# Patient Record
Sex: Male | Born: 1935 | Race: White | Hispanic: No | Marital: Married
Health system: Midwestern US, Academic
[De-identification: ages and names within clinical notes are randomized; demographics above are authoritative.]

---

## 2018-07-09 ENCOUNTER — Encounter: Admit: 2018-07-09 | Discharge: 2018-07-09 | Payer: MEDICARE

## 2018-07-09 ENCOUNTER — Inpatient Hospital Stay
Admit: 2018-07-09 | Discharge: 2018-07-13 | Disposition: A | Discharge status: Home Health | Payer: MEDICARE | Source: Other Acute Inpatient Hospital | Admitting: Critical Care Medicine

## 2018-07-09 DIAGNOSIS — I639 Cerebral infarction, unspecified: Principal | ICD-10-CM

## 2018-07-09 DIAGNOSIS — R740 Nonspecific elevation of levels of transaminase and lactic acid dehydrogenase [LDH]: ICD-10-CM

## 2018-07-09 DIAGNOSIS — I10 Essential (primary) hypertension: ICD-10-CM

## 2018-07-09 DIAGNOSIS — A419 Sepsis, unspecified organism: Secondary | ICD-10-CM

## 2018-07-09 LAB — URINALYSIS DIPSTICK
Lab: NEGATIVE
Lab: NEGATIVE
Lab: NEGATIVE
Lab: NEGATIVE
Lab: NEGATIVE
Lab: NEGATIVE
Lab: NEGATIVE

## 2018-07-09 LAB — COMPREHENSIVE METABOLIC PANEL
Lab: 1 mg/dL (ref 0.4–1.24)
Lab: 140 MMOL/L — ABNORMAL LOW (ref 137–147)
Lab: 2.7 mg/dL — ABNORMAL HIGH (ref 0.3–1.2)
Lab: 5.5 g/dL — ABNORMAL LOW (ref 6.0–8.0)
Lab: 558 U/L — ABNORMAL HIGH (ref 7–40)
Lab: 742 U/L — ABNORMAL HIGH (ref 7–56)
Lab: >60 mL/min (ref >60)

## 2018-07-09 LAB — BLOOD GASES, ARTERIAL
Lab: 0.5 MMOL/L
Lab: 116 mmHg — ABNORMAL HIGH (ref 80–100)
Lab: 34 mmHg — ABNORMAL LOW (ref 35–45)
Lab: 7.4 (ref 7.35–7.45)

## 2018-07-09 LAB — CREATININE-URINE RANDOM: Lab: 28 mg/dL

## 2018-07-09 LAB — C REACTIVE PROTEIN (CRP): Lab: 8.6 mg/dL — ABNORMAL HIGH (ref <1.0)

## 2018-07-09 LAB — TROPONIN-I: Lab: 1.1 ng/mL — ABNORMAL HIGH (ref 0.0–0.05)

## 2018-07-09 LAB — CBC AND DIFF
Lab: 0 10*3/uL (ref 0–0.20)
Lab: 15 10*3/uL — ABNORMAL HIGH (ref 4.5–11.0)

## 2018-07-09 LAB — PROCALCITONIN: Lab: 1.4 ng/mL — ABNORMAL HIGH (ref <0.11)

## 2018-07-09 LAB — D-DIMER: Lab: 454 ng{FEU}/mL — ABNORMAL HIGH (ref <500)

## 2018-07-09 LAB — FERRITIN: Lab: 822 ng/mL — ABNORMAL HIGH (ref 30–300)

## 2018-07-09 LAB — URINALYSIS, MICROSCOPIC

## 2018-07-09 LAB — LDH-LACTATE DEHYDROGENASE: Lab: 412 U/L — ABNORMAL HIGH (ref 100–210)

## 2018-07-09 LAB — SODIUM-URINE RANDOM: Lab: 25 MMOL/L

## 2018-07-09 LAB — PHOSPHORUS: Lab: 2.6 mg/dL (ref 2.0–4.5)

## 2018-07-09 MED ORDER — VANCOMYCIN 1G/250ML D5W IVPB (VIAL2BAG)
1000 mg | Freq: Once | INTRAVENOUS | 0 refills | Status: CP
Start: 2018-07-09 — End: ?
  Administered 2018-07-09 (×2): 1000 mg via INTRAVENOUS

## 2018-07-09 MED ORDER — ERTAPENEM 1GM IVP
1 g | INTRAVENOUS | 0 refills | Status: DC
Start: 2018-07-09 — End: 2018-07-12
  Administered 2018-07-10 – 2018-07-12 (×3): 1 g via INTRAVENOUS

## 2018-07-09 MED ORDER — CEFEPIME 2G/100ML NS IVPB (MB+)
2 g | Freq: Once | INTRAVENOUS | 0 refills | Status: DC
Start: 2018-07-09 — End: 2018-07-09
  Administered 2018-07-09 (×2): 2 g via INTRAVENOUS

## 2018-07-09 MED ORDER — VANCOMYCIN 1,500 MG IVPB
1500 mg | INTRAVENOUS | 0 refills | Status: DC
Start: 2018-07-09 — End: 2018-07-10

## 2018-07-09 MED ORDER — VANCOMYCIN IVPB
15 mg/kg | Freq: Once | INTRAVENOUS | 0 refills | Status: DC
Start: 2018-07-09 — End: 2018-07-09

## 2018-07-09 MED ORDER — VANCOMYCIN PHARMACY TO MANAGE
1 | 0 refills | Status: DC
Start: 2018-07-09 — End: 2018-07-10

## 2018-07-09 MED ORDER — ENOXAPARIN 40 MG/0.4 ML SC SYRG
40 mg | Freq: Every day | SUBCUTANEOUS | 0 refills | Status: DC
Start: 2018-07-09 — End: 2018-07-13
  Administered 2018-07-10 – 2018-07-13 (×4): 40 mg via SUBCUTANEOUS

## 2018-07-09 MED ORDER — CEFEPIME 2G/100ML NS IVPB (MB+)(EXTENDED INFUSION)
2 g | INTRAVENOUS | 0 refills | Status: DC
Start: 2018-07-09 — End: 2018-07-09

## 2018-07-09 NOTE — Progress Notes
gallstones in 2008. Patient was found to have a temp of 103 in the ED there and was hypotensive in the 70s systolic. He received 3 L of saline as bolus with improvement in his pressures to 90s systolic. His CT scan of the abd was unremarkable. He was also found to be confused per the wife who accompanies him.??? Patient was started on levaquin at the OSH and given his low blood pressure and the possible need for pressor support, he was transferred to the [facility] ICU via helicopter.??? Upon arrival he was normotensive, afebrile, pain free.??? ICU initially started him on Vanc, Cefipime and Flagyl and continued Levaquin but later switched his antibiotic coverage to Ertapenem and Vanc on 7/2.  U/S on 7/2 of abdomen unremarkable regarding gallbladder and bile ducts. During his hypotensive episode patient developed shock liver with LFTs trending down.??? His amylase and lipase at the OSH were normal and repeat labs at [place] are normal. CT-Chest on 7/3 consistent with aspiration vs pneumonia.  [place] gram stain of blood cultures revealed gram negative rods and vancomycin was discontinued on 7/3.  Patient continued to be stable throughout his admission with transaminases and white count trending down.  Patient seen by PT/OT/Speech Language pathology. Blood cultures from [facility] were positive for Klebsiella sensitive to Levaquin. Patient switched to Levaquin PO ending 10/04/2015.  Currently patient is stable with no complaints and is ready discharge.??? U/S on 7/2 of abdomen unremarkable regarding gallbladder and bile ducts.

## 2018-07-09 NOTE — Progress Notes
Patient arrived to room # [phone]*) via cart accompanied by EMS. Patient transferred to the bed with assistance. Bedside safety checks completed. Initial patient assessment completed. Refer to flowsheet for details.    Admission skin assessment completed with: Cherrie Distance, RN    Pressure injury present on arrival?: Yes    1. Head/Face/Neck: No  2. Trunk/Back: No  3. Upper Extremities: No  4. Lower Extremities: No  5. Pelvic/Coccyx: Yes  6. Assessed for device associated injury? Yes  7. Malnutrition Screening Tool (Nursing Nutrition Assessment) Completed? No    See Doc Flowsheet for additional wound details.     INTERVENTIONS: q2 turns and criticaid applied to coccyx PRN

## 2018-07-09 NOTE — H&P (View-Only)
- will discuss with general surgery once images available  - repeat LFTs  - check lipase    Possible dysphagia  - pt reports occasional coughing when eating  - SLP swallow study 2017: mild to moderate oropharyngeal dysphagia  - recommended regular solids with nectar thick liquids at that time  Plan  - NPO (for possible surgery)  - plan for SLP swallow study in the a.m.    RENAL  AKI  - baseline Cr ~ 0.6  - Cr 1.19, BUN 22 at OSH  Plan  - check FeNa  - avoid nephrotoxic agents    ENDO  - no acute issues    ID  Sepsis  - WBC 15.6, afebrile  - lactic acid 3.1  - blood cultures sent at OSH   - s/p vanco and cefepime at OSH  - h/o Klebsiella bacteremia at [facility] (susceptible to Levaquin, ertapenem)  Plan  - vanco, ertapenem  - culture blood  - check procalcitonin, RVP  - check MRSA pneumonia PCR    FEN  - no IVF  - replace electrolytes as needed  - NPO for now    PROPHYLAXIS  Lines: PIVs   Urinary Catheter:  No  VTE: Lovenox, SCDs  GI: No    Insulin: No  PT/OT: Yes    Code status: Full code  Disposition: Admit to ICU    99291 x 1, 99292 x 1 - pt critically ill with sepsis, NSTEMI (Type II), elevated liver enzymes, hypoxia, and hypotension responsive to fluids. I spent 85 minutes providing critical care services including:  ??? reviewing outside records and obtaining history from the patient/family members  ??? performing a physical examination  ??? serially reviewing laboratory, telemetry, hemodynamic, oximetry, and respiratory data  ??? reviewing radiographic images  ??? reviewing medications  ??? managing fluids/electrolytes, antibiotics, and ICU prophylaxis  ??? developing the overall plan of care.       [facility] Alaycia Eardley APRN  Pager [phone]  M2 pager [phone]  ___________________________________________________________________________  Primary Care Physician: Tildon Husky      CHIEF COMPLAINT:  Fall    HISTORY OF PRESENT ILLNESS: Stanley Garrett is a 83 y.o. male with PMH of CVA, CAD, HTN, and HLD who presented to [facility] this morning after falling. He endorsed weakness, but no dizziness. He did not hit his head. On presentation he was hypotensive, hypoxic, and had elevations in WBC and LFTs. He received of NS with improvement of BP. CT head negative for acute process. CT abdomen showed a dilated gallbladder concerning for acalculous cholecystitis. He denies abdominal pain. There was no surgeon available at [facility] so he was transferred to [facility] for possible surgical evaluation. He denies fevers, recent illness, and sick contacts. He lives with his wife and uses a walker to get around the house. The only medication he takes is simvastatin.    PMH:  Medical History:   Diagnosis Date   ??? CVA (cerebral vascular accident) (HCC)    ??? HTN (hypertension)         PSH:  Surgical History:   Procedure Laterality Date   ??? PROSTATE SURGERY          SOCIAL HISTORY:  Social History     Socioeconomic History   ??? Marital status: Married     Spouse name: Not on file   ??? Number of children: Not on file   ??? Years of education: Not on file   ??? Highest education level: Not on file  Occupational History   ??? Not on file   Tobacco Use   ??? Smoking status: Former Smoker   Substance and Sexual Activity   ??? Alcohol use: Not on file   ??? Drug use: Not on file   ??? Sexual activity: Not on file   Other Topics Concern   ??? Not on file   Social History Narrative   ??? Not on file        FAMILY HISTORY:  Family History   Problem Relation Age of Onset   ??? Heart Failure Father         IMMUNIZATIONS:   There is no immunization history on file for this patient.        ALLERGIES:  Penicillins and Sulfa (sulfonamide antibiotics)    HOME MEDICATIONS:  Medications Prior to Admission   Medication Sig   ??? fesoterodine ER(+) (TOVIAZ) 8 mg tablet Take 1 Tab by mouth daily.   ??? fish oil /omega-3 fatty acids (SEA-OMEGA) 340/1000 mg capsule Take 1 Cap by mouth daily. PHART 7.44   PO2ART 116*                 Radiology and Other Diagnostic Procedures Review:    Reviewed

## 2018-07-09 NOTE — Progress Notes
Pharmacy Vancomycin Note  Subjective:   Stanley Garrett is a 83 y.o. male being treated for Sepsis.    Objective:     Current Vancomycin Orders   Medication Dose Route Frequency   ??? vancomycin (VANCOCIN) 1,000 mg in dextrose 5% (D5W) 250 mL IVPB (Vial2Bag)  1,000 mg Intravenous ONCE    Followed by   ??? [START ON 07/10/2018] vancomycin (VANCOCIN) 1,500 mg in sodium chloride 0.9% (NS) IVPB  1,500 mg Intravenous Q24H*   ??? vancomycin, pharmacy to manage  1 each Service Per Pharmacy     Start Date of  vancomycin therapy: 07/09/2018  Additional Abx: cefepime    White Blood Cells   Date/Time Value Ref Range Status   07/09/2018 1424 15.1 (H) 4.5 - 11.0 K/UL Final     Creatinine   Date/Time Value Ref Range Status   07/09/2018 1424 1.07 0.4 - 1.24 MG/DL Final     Blood Urea Nitrogen   Date/Time Value Ref Range Status   07/09/2018 1424 19 7 - 25 MG/DL Final     Estimated CrCl: 60 ml/min  No intake or output data in the 24 hours ending 07/09/18 1624   UOP:    Actual Weight:  78.2 kg (172 lb 6.4 oz)  Dosing BW:  78 kg   Drug Levels:  No results found for: VANCOMYCIN 2HR POST DOSE, VANCOMYCIN TROUGH, VANCOMYCIN RANDOM    Assessment:   Target levels for this patient:  1.  AUC (mcg*h/mL):  400-600  2.  Trough 10-15 mcg/ml    Plan:   1. Patient received 1 g vancomycin at OSH 1130 today. Will give an additional 1 g now to complete 2 g loading dose (25 mg/kg) then begin 1500 mg q24h. This will attain predicted AUC 506, trough 11.1  2. Next scheduled level(s): TBD  3. Pharmacy will continue to monitor and adjust therapy as needed.    Franz Dell, [facility]  07/09/2018

## 2018-07-10 ENCOUNTER — Encounter: Admit: 2018-07-10 | Discharge: 2018-07-10 | Payer: MEDICARE

## 2018-07-10 ENCOUNTER — Inpatient Hospital Stay: Admit: 2018-07-10 | Discharge: 2018-07-10 | Payer: MEDICARE

## 2018-07-10 DIAGNOSIS — J690 Pneumonitis due to inhalation of food and vomit: Secondary | ICD-10-CM

## 2018-07-10 LAB — RVP VIRAL PANEL PCR

## 2018-07-10 LAB — TROPONIN-I
Lab: 1.1 ng/mL — ABNORMAL HIGH (ref 0.0–0.05)
Lab: 1.3 ng/mL — ABNORMAL HIGH (ref 0.0–0.05)
Lab: 0.8 ng/mL — ABNORMAL HIGH (ref 0.0–0.05)

## 2018-07-10 LAB — MRSA PNEUMONIA SCREEN

## 2018-07-10 MED ORDER — SODIUM CHLORIDE 0.9 % IV SOLP
INTRAVENOUS | 0 refills | Status: DC
Start: 2018-07-10 — End: 2018-07-12
  Administered 2018-07-10 – 2018-07-12 (×4): 1000.000 mL via INTRAVENOUS

## 2018-07-10 MED ORDER — SODIUM CHLORIDE 0.9 % IJ SOLN
50 mL | Freq: Once | INTRAVENOUS | 0 refills | Status: CP
Start: 2018-07-10 — End: ?
  Administered 2018-07-11: 01:00:00 50 mL via INTRAVENOUS

## 2018-07-10 MED ORDER — IOHEXOL 350 MG IODINE/ML IV SOLN
80 mL | Freq: Once | INTRAVENOUS | 0 refills | Status: CP
Start: 2018-07-10 — End: ?
  Administered 2018-07-11: 01:00:00 80 mL via INTRAVENOUS

## 2018-07-10 NOTE — Progress Notes
1350: Assumed pt care. Bedside safety check performed. Assessment complete per ICU flowsheet. VSS per pt trends. Will continue to monitor. Patient alert, oriented, following commands, no pain, plan of care reviewed.    1600: Assessment complete per ICU flowsheet. VSS per pt trends. Will continue to monitor. Patient alert, oriented, following commands, no pain.   1641: Patient's Troponin from admission resulted as 1.16, EKG obtained. Patient in SR, with RBB and Lafaire[place], APRN informed.

## 2018-07-10 NOTE — Case Management (ED)
Patient Address/Phone  [address]  [place] [address]  [phone] (home)     Emergency Contact  Extended Emergency Contact Information  Primary Emergency Contact: Kemet, Nijjar  Work Phone: [phone]  Mobile Phone: [phone]  Relation: Spouse    Healthcare Directive       Transportation  Does the patient need discharge transport arranged?: No  Transportation Name, Phone and Availability #1: Pts wife Dale Durham  Does the patient use Medicaid Transportation?: No    Expected Discharge Date  Expected Discharge Date: 07/13/18  Expected Discharge Time: 1600    Living Situation Prior to Admission  ? Living Arrangements  Type of Residence: Home, dependent on others  Living Arrangements: Spouse/significant other  Financial risk analyst / Tub: Psychologist, counselling  How many levels in the residence?: 1  Can patient live on one level if needed?: Yes  Does residence have entry and/or side stairs?: (Ramp access)  Assistance needed prior to admit or anticipated on discharge: Yes  Who provides assistance or could if needed?: Pts wife  Are they in good health?: Yes  Can support system provide 24/7 care if needed?: Yes  ? Level of Function   Prior level of function: Needs assist with ADLs  Which ADLs require assistance?: Dressing, bathing  Who assists with ADLs?: Pts wife  ? Cognitive Abilities   Cognitive Abilities: Alert and Oriented, Engages in problem solving and planning, Participates in decision making, Recognizes impact of health condition on lifestyle    Financial Resources  ? Coverage  Primary Insurance: Medicare  Secondary Insurance: Medicare Supplement  Additional Coverage: RX    ? Source of Income   Source Of Income: SSI  ? Financial Assistance Needed?  N/A    Psychosocial Needs  ? Mental Health  Mental Health History: No  ? Substance Use History  Substance Use History Screen: No  ? Other  N/A    Current/Previous Services  ? PCP  Tildon Husky, [phone], [phone]  ? Pharmacy

## 2018-07-10 NOTE — Progress Notes
CAD, HTN, and HLD who presented to [facility] this morning after falling. He was hypotensive on arrival to the OSH which resolved with of NS.???Labwork showed elevated WBCs and elevated liver enzymes.???A CT abdomen was concerning for acalculous cholecystitis so he was transferred to [facility] for surgical evaluation.    IMPRESSION CXR   Development of partially consolidating bibasilar opacities, greatest at   the medial lower lobes and favored to represent a combination of   atelectasis and pneumonia.    Handedness: Right  Hearing: WFL  Lives With: Spouse  Receives Help From: Spouse  Psychosocial Status: Willing and Cooperative to Participate  Persons Present: None    Subjective*  Pain: Patient has no complaint of pain  Pain Level Current*: No pain  Trach Presence: No  Feeding Tube Present During Eval: None    Nutrition*  Nutrition Prior To Hospitalization: Regular, Thin Liquids(per pt report he has not been taking thickened liquids)  Current Form Of Nutrition: NPO    Oral Mech Exam  Oral Mech WFL*: No  Oral Mech Exam Summary*: Minimal dysphonia observed; slowed labial AMRs w/ normal ROM and strength of jaw, tongue & labial structures.      Swallow Strategies  Small Bites and Sips: Effective(which pt effectively independently completed)  Slow Rate of Intake: Effective    Education*  Persons Educated: Patient  Barriers To Learning: Cognitive Deficits  Interventions: Staff Educated  Teaching Methods: Verbal  Topics: Dysphagia  Patient Response: Verbalized Understanding  Goal Formulation: With Patient    Clinical Swallow Goals*  Goal : The pt will tolerate a soft diet w/ thin liquids w/ < 5% overt s/s aspiation.   Goal : The pt will participate in ongoing swallow eval given min cues to maintain attention.      Therapist:Jaelen Soth Millard-Harsche, MA, L/CCC-SLP         Voalte: [id number]  Date:07/10/2018

## 2018-07-10 NOTE — Progress Notes
Gastrointestinal: positive for dysphagia    Objective:                          Vital Signs: Last Filed                 Vital Signs: 24 Hour Range   BP: 132/72 (04/20 1036)  Temp: 37.1 ???C (98.7 ???F) (04/20 1036)  Pulse: 63 (04/20 1036)  Respirations: 18 PER MINUTE (04/20 1036)  SpO2: 93 % (04/20 1036)  SpO2 Pulse: 58 (04/20 0900) BP: (109-142)/(50-72)   Temp:  [36.7 ???C (98.1 ???F)-37.1 ???C (98.8 ???F)]   Pulse:  [58-82]   Respirations:  [8 PER MINUTE-28 PER MINUTE]   SpO2:  [92 %-99 %]      Vitals:    07/09/18 1351   Weight: 78.2 kg (172 lb 6.4 oz)       Intake/Output Summary:  (Last 24 hours)    Intake/Output Summary (Last 24 hours) at 07/10/2018 1409  Last data filed at 07/10/2018 1247  Gross per 24 hour   Intake 360 ml   Output 1108 ml   Net -748 ml           Physical Exam    General: AO3x, no  distress   Head: NCAT  Eyes: PERRLA EOMI,   ENT: Normal external canals b/l  Neck: supple   Lungs: Bibasilar coarse rales right more than left  Chest wall: No tenderness  Heart: RRR, no murmurs.  Abdomen: soft ,non-tender, bs+  Extremities: no  edema b/l  Peripheral pulses: 2+ and symmetric, all extremities  Skin: Pale Skin, texture, turgor normal.  Lymph nodes: No cervical LAD  Neurologic:grossly normal except mild residual left-sided weakness  Musculoskeletal: ntd  Lab Review  24-hour labs:    Results for orders placed or performed during the hospital encounter of 07/09/18 (from the past 24 hour(s))   CBC AND DIFF    Collection Time: 07/09/18  2:24 PM   Result Value Ref Range    White Blood Cells 15.1 (H) 4.5 - 11.0 K/UL    RBC 4.33 (L) 4.4 - 5.5 M/UL    Hemoglobin 13.3 (L) 13.5 - 16.5 GM/DL    Hematocrit 16.1 40 - 50 %    MCV 92.3 80 - 100 FL    MCH 30.7 26 - 34 PG    MCHC 33.3 32.0 - 36.0 G/DL    RDW 09.6 11 - 15 %    Platelet Count 159 150 - 400 K/UL    MPV 10.0 7 - 11 FL    Neutrophils 85 (H) 41 - 77 %    Lymphocytes 8 (L) 24 - 44 %    Monocytes 7 4 - 12 %    Eosinophils 0 0 - 5 %    Basophils 0 0 - 2 % Ferritin 822 (H) 30 - 300 NG/ML   LDH-LACTATE DEHYDROGENASE    Collection Time: 07/09/18  2:24 PM   Result Value Ref Range    Lactate Dehydrogenase 412 (H) 100 - 210 U/L   LIPASE    Collection Time: 07/09/18  2:24 PM   Result Value Ref Range    Lipase 10 (L) 11 - 82 U/L   CULTURE-BLOOD W/SENSITIVITY    Collection Time: 07/09/18  2:55 PM   Result Value Ref Range    Battery Name BLOOD CULTURE     Specimen Description BLOOD  RIGHT  ANTECUBITAL       Special Requests  NONE     Culture NO GROWTH 1 DAY     Report Status     CULTURE-BLOOD W/SENSITIVITY    Collection Time: 07/09/18  4:05 PM   Result Value Ref Range    Battery Name BLOOD CULTURE     Specimen Description BLOOD  RIGHT  ARM       Special Requests NONE     Culture NO GROWTH 1 DAY     Report Status     BLOOD GASES, ARTERIAL    Collection Time: 07/09/18  4:05 PM   Result Value Ref Range    pH-Arterial 7.44 7.35 - 7.45    pCO2-Arterial 34 (L) 35 - 45 MMHG    pO2-Arterial 116 (H) 80 - 100 MMHG    Base Deficit-Arterial 0.5 MMOL/L    O2 Sat-Arterial 98.6 95 - 99 %    Bicarbonate-ART-Cal 24.0 21 - 28 MMOL/L   CREATININE-URINE RANDOM    Collection Time: 07/09/18  4:36 PM   Result Value Ref Range    Creatinine, Random 28 MG/DL   SODIUM-URINE RANDOM    Collection Time: 07/09/18  4:36 PM   Result Value Ref Range    Sodium, Random 25 MMOL/L   RVP VIRAL PANEL PCR    Collection Time: 07/09/18  5:17 PM   Result Value Ref Range    Specimen Source       NASOPHARYNGEAL SWAB  This panel does not detect SARS-CoV-2, the etiologic agent of COVID-19. Contact   Infection Control for testing of patients with suspected SARS-CoV-2 infection.   This assay uses analyte specific reagents and has not been cleared by the [place]   Food and Drug Administration. The performance characterics were determined by   the [facility].      Adenovirus NOT DETECTED DN-NOT DETECTED    Coronavirus 229E NOT DETECTED DN-NOT DETECTED Coronavirus HKU1 NOT DETECTED DN-NOT DETECTED    Coronavirus NL63 NOT DETECTED DN-NOT DETECTED    Coronavirus OC43 NOT DETECTED DN-NOT DETECTED    Human Metapneumovirus NOT DETECTED DN-NOT DETECTED    Human Rhinovirus/ENTEROVIRUS NOT DETECTED DN-NOT DETECTED    Influenza A H1N1 2009 NOT DETECTED DN-NOT DETECTED    Influenza A H1 NOT DETECTED DN-NOT DETECTED    Influenza A H3 NOT DETECTED DN-NOT DETECTED    Influenza B NOT DETECTED DN-NOT DETECTED    Parainfluenza 1 NOT DETECTED DN-NOT DETECTED    Parainfluenza 2 NOT DETECTED DN-NOT DETECTED    Parainfluenza 3 NOT DETECTED DN-NOT DETECTED    Parainfluenza 4 NOT DETECTED DN-NOT DETECTED    RSV NOT DETECTED DN-NOT DETECTED    Bordetella Pertussis NOT DETECTED DN-NOT DETECTED    Chlamydophila Pneumoniae NOT DETECTED DN-NOT DETECTED    Mycoplasma Pneumoniae NOT DETECTED DN-NOT DETECTED   URINALYSIS DIPSTICK    Collection Time: 07/09/18  5:17 PM   Result Value Ref Range    Color,UA AMBER     Turbidity,UA CLEAR CLEAR-CLEAR    Specific Gravity-Urine 1.031 1.003 - 1.035    pH,UA 5.0 5.0 - 8.0    Protein,UA NEG NEG-NEG    Glucose,UA NEG NEG-NEG    Ketones,UA NEG NEG-NEG    Bilirubin,UA NEG NEG-NEG    Blood,UA 2+ (A) NEG-NEG    Urobilinogen,UA NORMAL NORM-NORMAL    Nitrite,UA NEG NEG-NEG    Leukocytes,UA NEG NEG-NEG    Urine Ascorbic Acid, UA NEG NEG-NEG   URINALYSIS, MICROSCOPIC    Collection Time: 07/09/18  5:17 PM   Result Value Ref Range  WBCs,UA 0-2 0 - 2 /HPF    RBCs,UA PACKED 0 - 3 /HPF   MRSA PNEUMONIA SCREEN    Collection Time: 07/09/18  5:55 PM   Result Value Ref Range    MRSA Pneumonia PCR       NOT DETECTED  The negative predictive value of this assay for MRSA pneumonia is high.    Discontinuation of anti-MRSA pneumonia therapy is recommended in patients   without additional clinical features that warrant MRSA therapy.  Contact   infectious Diseases or Antimicrobial Stewardship with questions.     TROPONIN-I    Collection Time: 07/09/18  6:59 PM Troponin-I 0.85 (H) 0.0 - 0.05 NG/ML       Point of Care Testing  (Last 24 hours)  Glucose: 93 (07/10/18 0432)    Radiology and other Diagnostics Review:    Pertinent radiology reviewed.    Otto Herb, MD   Pager

## 2018-07-10 NOTE — Progress Notes
Patient arrived to room # [phone]) via wheelchair accompanied by RN. Patient transferred to the bed with assistance. Bedside safety checks completed. Initial patient assessment completed. Refer to flowsheet for details.    Admission skin assessment completed with: Clara, RN & Vallarie Mare, RN    Pressure injury present on arrival?: Yes    1. Head/Face/Neck: No  2. Trunk/Back: No  3. Upper Extremities: No  4. Lower Extremities: No  5. Pelvic/Coccyx: Yes  6. Assessed for device associated injury? Yes  7. Malnutrition Screening Tool (Nursing Nutrition Assessment) Completed? Yes    See Doc Flowsheet for additional wound details.     INTERVENTIONS:       Stage 1 Coccyx  Left elbow and lower arm skin tears    No new wounds present upon transfer to unit 64.

## 2018-07-11 ENCOUNTER — Encounter: Admit: 2018-07-11 | Discharge: 2018-07-11 | Payer: MEDICARE

## 2018-07-11 DIAGNOSIS — I10 Essential (primary) hypertension: ICD-10-CM

## 2018-07-11 DIAGNOSIS — I639 Cerebral infarction, unspecified: Principal | ICD-10-CM

## 2018-07-11 LAB — COMPREHENSIVE METABOLIC PANEL
Lab: 0.9 mg/dL (ref 0.4–1.24)
Lab: 24 MMOL/L (ref 21–30)
Lab: 309 U/L — ABNORMAL HIGH (ref 7–56)
Lab: 5.5 g/dL — ABNORMAL LOW (ref 6.0–8.0)
Lab: 74 U/L — ABNORMAL HIGH (ref 7–40)
Lab: 8 10*3/uL (ref 3–12)
Lab: 8.3 mg/dL — ABNORMAL LOW (ref 8.5–10.6)
Lab: 96 mg/dL (ref 70–100)
Lab: >60 mL/min (ref >60)

## 2018-07-11 LAB — CBC AND DIFF
Lab: 0 10*3/uL (ref 0–0.20)
Lab: 0.1 10*3/uL (ref 0–0.45)
Lab: 7 10*3/uL (ref 4.5–11.0)

## 2018-07-11 LAB — LEGIONELLA ANTIGEN URINE,RAN: Lab: NEGATIVE 10*3/uL (ref 1.0–4.8)

## 2018-07-11 LAB — STREPTOCOCCUS PNEUMO AG, URINE

## 2018-07-11 LAB — PHOSPHORUS: Lab: 1.9 mg/dL — ABNORMAL LOW (ref 2.0–4.5)

## 2018-07-11 MED ORDER — BARIUM SULFATE 40 % (W/V) 29% (W/W) PO SUSP
10 mL | Freq: Once | ORAL | 0 refills | Status: CP
Start: 2018-07-11 — End: ?
  Administered 2018-07-11: 19:00:00 10 mL via ORAL

## 2018-07-11 MED ORDER — BARIUM SULFATE 40 % (W/V), 30% (W/W) PO PSTE
10 mL | Freq: Once | ORAL | 0 refills | Status: CP
Start: 2018-07-11 — End: ?
  Administered 2018-07-11: 19:00:00 10 mL via ORAL

## 2018-07-11 MED ORDER — BARIUM SULFATE 40 % (W/V) PO SUSP
10 mL | Freq: Once | ORAL | 0 refills | Status: CP
Start: 2018-07-11 — End: ?
  Administered 2018-07-11: 19:00:00 20 mL via ORAL

## 2018-07-11 MED ORDER — GADOBENATE DIMEGLUMINE 529 MG/ML (0.1MMOL/0.2ML) IV SOLN
15 mL | Freq: Once | INTRAVENOUS | 0 refills | Status: CP
Start: 2018-07-11 — End: ?
  Administered 2018-07-12: 15 mL via INTRAVENOUS

## 2018-07-11 MED ORDER — BARIUM SULFATE 40 % (W/V) PO POWD
10 mL | Freq: Once | ORAL | 0 refills | Status: CP
Start: 2018-07-11 — End: ?
  Administered 2018-07-11: 19:00:00 60 mL via ORAL

## 2018-07-11 MED ORDER — PERFLUTREN LIPID MICROSPHERES 1.1 MG/ML IV SUSP
1-20 mL | Freq: Once | INTRAVENOUS | 0 refills | Status: CP | PRN
Start: 2018-07-11 — End: ?
  Administered 2018-07-11: 19:00:00 2 mL via INTRAVENOUS

## 2018-07-11 NOTE — Progress Notes
OCCUPATIONAL THERAPY  NOTE       Name: HOLBERT CAPLES        MRN: [id number]          DOB: 1935/08/14          Age: 83 y.o.  Admission Date: 07/09/2018             LOS: 2 days    Patient was unavailable for occupational therapy.  Off unit for video swallow. Occupational therapy will continue to follow and provide intervention as indicated.    Therapist: Lilian Coma, OTR/L [id number]  Date: 07/11/2018

## 2018-07-11 NOTE — Progress Notes
Patient arrived to room # [phone]*) via wheelchair accompanied by transport. Patient transferred to the bed with assistance. Bedside safety checks completed. Initial patient assessment completed. Refer to flowsheet for details.    Admission skin assessment completed with: Aundra Millet, RN    Pressure injury present on arrival?: Yes    1. Head/Face/Neck: No  2. Trunk/Back: No  3. Upper Extremities: Yes  4. Lower Extremities: Yes  5. Pelvic/Coccyx: Yes  6. Assessed for device associated injury? Yes  7. Malnutrition Screening Tool (Nursing Nutrition Assessment) Completed? Yes    See Doc Flowsheet for additional wound details.     INTERVENTIONS:   New wounds charted, criticaid placed on coccyx. RN will continue to monitor

## 2018-07-12 MED ORDER — METRONIDAZOLE 500 MG PO TAB
500 mg | Freq: Three times a day (TID) | ORAL | 0 refills | Status: DC
Start: 2018-07-12 — End: 2018-07-13
  Administered 2018-07-12 – 2018-07-13 (×3): 500 mg via ORAL

## 2018-07-12 MED ORDER — HYDRALAZINE 20 MG/ML IJ SOLN
5 mg | INTRAVENOUS | 0 refills | Status: DC | PRN
Start: 2018-07-12 — End: 2018-07-13
  Administered 2018-07-12: 23:00:00 5 mg via INTRAVENOUS

## 2018-07-12 MED ORDER — LEVOFLOXACIN 750 MG PO TAB
750 mg | ORAL | 0 refills | Status: DC
Start: 2018-07-12 — End: 2018-07-13
  Administered 2018-07-12: 19:00:00 750 mg via ORAL

## 2018-07-12 MED ORDER — LISINOPRIL 10 MG PO TAB
10 mg | Freq: Every day | ORAL | 0 refills | Status: DC
Start: 2018-07-12 — End: 2018-07-13
  Administered 2018-07-13: 13:00:00 10 mg via ORAL

## 2018-07-12 NOTE — Care Coordination-Inpatient
Spoke with Dr Myrtis Ser, MRI negative for new stroke. Patient and wife want him home ASAP. I think he can follow up with PCP about [facility] DNR and hospice care down the road. We will sign off, please voalte or page with new palliative concerns. Thanks for letting us help!    Lanice Schwab, DO  Palliative Medicine Attending  Pager: [phone], or find me on [facility] - Page [phone] for Palliative Care On-Call

## 2018-07-12 NOTE — Progress Notes
Endurance: 3/5 Tolerates 25-30 Minutes Exercise w/Multiple Rests  Sitting Balance: 4/5 Moves/Returns Trunkal Midpoint 1-2 Inches in Multiple Planes    Cognition  Overall Cognitive Status: WFL to Adequately Complete Self Care Tasks Safely  Orientation: Alert & Oriented x4  Attention: Awake/Alert    UE PROM  Overall BUE PROM WNL: Yes    UE AROM  Overall BUE AROM WNL: Yes  Coordination: Adequate to Complete ADLs  Grasp: Bilateral Grasp Functional for Activity Left is weaker due to previous CVA    Edema  RUE Edema: No Significant Edema  LUE Edema: No Significant Edema    UE Strength / Tone  Overall Strength / Tone: WFL Able to Perform ADL Tasks  Overall Strength < 4/5: No    Education  Persons Educated: Patient  Barriers To Learning: None Noted  Teaching Methods: Verbal Instruction  Patient Response: Verbalized Understanding  Topics: Role of OT, Goals for Therapy;Home safety    Assessment  Assessment: Decreased ADL Status;Decreased Endurance;Decreased High-Level ADLs;Decreased Self-Care Trans  Prognosis: Fair;w/Cont OT s/p Acute Discharge  Goal Formulation: Patient  Comments: Pt presents with decreased strength and endurance however is likely baseline due to previous CVA affecting his left side. Patient has been able to manage at home prior to admission with assistance from spouse.    Plan  OT Frequency: 3-5x/week  OT Plan for Next Visit: toileting in the bathroom    ADL Goals  Patient Will Perform All ADL's: w/ Stand By Assist    Functional Transfer Goals  Pt Will Perform All Functional Transfers: w/ Stand By Assist    OT Discharge Recommendations  Recommendation: Home with consistent supervision/assistance (vs. inpatient depending on level of care spouse able to provide)  Recommendation for Therapy Post Discharge: Home health  Patient Currently Requires Physical Assist With: Ambulation;Dressing;Bathing;Meal preparation;Stairs    Therapist: Roxy Horseman, OTR/L [id number]  Date: 07/12/2018

## 2018-07-13 ENCOUNTER — Encounter: Admit: 2018-07-13 | Discharge: 2018-07-13 | Payer: MEDICARE

## 2018-07-13 ENCOUNTER — Inpatient Hospital Stay: Admit: 2018-07-09 | Discharge: 2018-07-09 | Payer: MEDICARE

## 2018-07-13 ENCOUNTER — Inpatient Hospital Stay: Admit: 2018-07-11 | Discharge: 2018-07-11 | Payer: MEDICARE

## 2018-07-13 DIAGNOSIS — K7200 Acute and subacute hepatic failure without coma: ICD-10-CM

## 2018-07-13 DIAGNOSIS — J9601 Acute respiratory failure with hypoxia: ICD-10-CM

## 2018-07-13 DIAGNOSIS — I959 Hypotension, unspecified: ICD-10-CM

## 2018-07-13 DIAGNOSIS — K819 Cholecystitis, unspecified: ICD-10-CM

## 2018-07-13 DIAGNOSIS — Z8673 Personal history of transient ischemic attack (TIA), and cerebral infarction without residual deficits: ICD-10-CM

## 2018-07-13 DIAGNOSIS — I452 Bifascicular block: ICD-10-CM

## 2018-07-13 DIAGNOSIS — N179 Acute kidney failure, unspecified: ICD-10-CM

## 2018-07-13 DIAGNOSIS — E861 Hypovolemia: ICD-10-CM

## 2018-07-13 DIAGNOSIS — Z66 Do not resuscitate: ICD-10-CM

## 2018-07-13 DIAGNOSIS — I712 Thoracic aortic aneurysm, without rupture: ICD-10-CM

## 2018-07-13 DIAGNOSIS — I10 Essential (primary) hypertension: ICD-10-CM

## 2018-07-13 DIAGNOSIS — I69354 Hemiplegia and hemiparesis following cerebral infarction affecting left non-dominant side: ICD-10-CM

## 2018-07-13 DIAGNOSIS — A419 Sepsis, unspecified organism: Principal | ICD-10-CM

## 2018-07-13 DIAGNOSIS — E785 Hyperlipidemia, unspecified: ICD-10-CM

## 2018-07-13 DIAGNOSIS — R6521 Severe sepsis with septic shock: ICD-10-CM

## 2018-07-13 DIAGNOSIS — Z87891 Personal history of nicotine dependence: ICD-10-CM

## 2018-07-13 DIAGNOSIS — I2510 Atherosclerotic heart disease of native coronary artery without angina pectoris: ICD-10-CM

## 2018-07-13 DIAGNOSIS — I214 Non-ST elevation (NSTEMI) myocardial infarction: ICD-10-CM

## 2018-07-13 DIAGNOSIS — J690 Pneumonitis due to inhalation of food and vomit: ICD-10-CM

## 2018-07-13 MED ORDER — LEVOFLOXACIN 750 MG PO TAB
750 mg | ORAL_TABLET | ORAL | 0 refills | 7.00000 days | Status: DC
Start: 2018-07-13 — End: 2018-07-13

## 2018-07-13 MED ORDER — METRONIDAZOLE 500 MG PO TAB
500 mg | ORAL_TABLET | Freq: Three times a day (TID) | ORAL | 0 refills | Status: DC
Start: 2018-07-13 — End: 2018-07-13

## 2018-07-13 MED ORDER — SIMVASTATIN 40 MG PO TAB
40 mg | ORAL_TABLET | Freq: Every evening | ORAL | 0 refills | Status: CN
Start: 2018-07-13 — End: ?

## 2018-07-13 MED ORDER — METRONIDAZOLE 500 MG PO TAB
500 mg | ORAL_TABLET | Freq: Three times a day (TID) | ORAL | 0 refills | Status: AC
Start: 2018-07-13 — End: ?

## 2018-07-13 MED ORDER — LEVOFLOXACIN 750 MG PO TAB
750 mg | ORAL_TABLET | ORAL | 0 refills | 7.00000 days | Status: AC
Start: 2018-07-13 — End: ?

## 2018-07-13 NOTE — Care Plan
Problem: Discharge Planning  Goal: Participation in plan of care  Outcome: Goal Achieved  Goal: Knowledge regarding plan of care  Outcome: Goal Achieved  Goal: Prepared for discharge  Outcome: Goal Achieved     Problem: Anxiety  Goal: Alleviation of anxiety  Outcome: Goal Achieved     Problem: Pain  Goal: Management of pain  Outcome: Goal Achieved  Goal: Knowledge of pain management  Outcome: Goal Achieved     Problem: Respiratory Impairment (Non-Ventilated Patient)  Goal: Effective gas exchange  Outcome: Goal Achieved  Goal: Effective breathing pattern  Outcome: Goal Achieved  Goal: Patent airway  Outcome: Goal Achieved     Problem: Tissue Perfusion, Altered  Goal: Adequate tissue perfusion  Outcome: Goal Achieved     Problem: Skin Integrity  Goal: Skin integrity intact  Outcome: Goal Achieved  Goal: Healing of skin (Wound & Incision)  Outcome: Goal Achieved  Goal: Healing of skin (Pressure Injury)  Outcome: Goal Achieved     Problem: Fluid Volume, Imbalanced  Goal: Absence of dehydration  Outcome: Goal Achieved  Goal: Absence of fluid overload  Outcome: Goal Achieved     Problem: Infection, Risk of  Goal: Absence of infection  Outcome: Goal Achieved  Goal: Knowledge of Infection Control Procedures  Outcome: Goal Achieved     Problem: Aspiration, Risk of  Goal: Tolerates oral intake  Outcome: Goal Achieved     Problem: Falls, High Risk of  Goal: Absence of falls-Adult Patient  Outcome: Goal Achieved     Problem: Mobility/Activity Intolerance  Goal: Maximize functional ADL's and mobility outcomes  Outcome: Goal Achieved     Problem: Self-Care Deficit  Goal: Maximize ADL functioning  Outcome: Goal Achieved

## 2018-07-14 NOTE — Telephone Encounter
I was contacted by the floor nurse at 6:50 pm to inform me that the blood culture from the 19th of April is growing gram negative rods.    Given these findings, I called the patient to update him about this result. His wife picked up the phone as the patient was sleeping. She told me that he has been feeling well and taking his oral antibiotics. I informed her about the results of the blood culture from the 19th and told her that this is likely a true infection in the blood. Given these results, I explained that the safest option and my medical recommendation is for him to come back to the hospital for repeat blood cultures and ID consultation. The wife stated that they live far away and that it is difficult for them to come back here today. She stated that she will take him to [facility] emergency tomorrow morning to repeat the blood cultures and get their thoughts on treatments. I informed her that this would not be my primary recommendation as despite being on oral antibiotics, I can't guarantee that he would not deteriorate clinically, get sicker from systemic infection and potentially die. The wife understood these risks and elected to take him to [facility]'s ED tomorrow for evaluation given difficulties arranging a ride back to [place] tonight or there tonight.    Sanjuana Letters, MD   [facility]  07/13/18

## 2018-07-15 LAB — CULTURE-BLOOD W/SENSITIVITY

## 2018-07-15 NOTE — Discharge Instructions - Pharmacy
PO4 2.6                07/09/18  1424   WBC 15.1*   HGB 13.3*   HCT 40.0   PLTCT 159   INR 1.3*   AST 558*   ALT 742*   ALKPHOS 183*   TNI 1.16*   ???       Brief Hospital Course:  The patient was admitted and the following issues were addressed during this hospitalization: (with pertinent details).      Elevated LFTs likely 2/2 shock liver - improving  - OSH???labs: AST 1044, ALT 1130, Alk phos 255, Tbili 3.23  - Likely related to hypotensive episode at the outside hospital.  - Initially there was concern for acalculous cholecystitis based on obstructive hepatitis and CT, but labs and U/S imaging here were not consistent with this   - Markedly improved LFTs, consistent with recovering shock liver.   - Continue to hold PTA statin until follow up with PCP and repeat CMP obtained to ensure improvement in LFTs  ???  Falls  History of CVA  - Endorsed two falls prior to seeking medical attention  - OSH CT Head w/ no acute intracranial hemorrhage  - MRI Brain on admission here w/ no acute or recent infarct  ???  Acute hypoxic respiratory failure   Sepsis 2/2 aspiration pneumonia POA  - History of Klebsiella bacteremia 2/2 pneumonia at [facility] (susceptible to Levaquin, Ertapenem)  - S/p Vanc and Cefepime at OSH. Per outside records, SpO2 89% in ED, required 2L NC  - CXR with bilateral lower lobe opacities  - Procalcitonin 1.46  - RVP negative. MRSA pneumonia PCR negative.  - SARS-CoV-2 negative 4/20  - CTA Chest consistent with aspiration pneumonia 4/20  - Patient remained afebrile, and leukocytosis resolved. He remained on room air.   - Initiated on Ertapenem on admission then changed to PO Levaquin and Flagyl to complete 7 day course.  - Blood cultures were negative but after discharge, 1 blood culture returned positive with GNR. Other provider spoke with patient and advised to go to the nearest ED. This provider spoke with his PCP about these results.   ???  Dysphagia level more quickly.  Continue to increase the number of times you are up to the chair and walking daily to return to your normal activity level. Begin to work toward your normal activity level at discharge      Current Discharge Medication List       START taking these medications    Details   levoFLOXacin (LEVAQUIN) 750 mg tablet Take one tablet by mouth every 24 hours. Indications: Community Acquired Pneumonia  Qty: 3 tablet, Refills: 0    PRESCRIPTION TYPE:  Normal      metroNIDAZOLE (FLAGYL) 500 mg tablet Take one tablet by mouth three times daily. Take with food. Do not drink alcohol while on metronidazole.  Qty: 9 tablet, Refills: 0    PRESCRIPTION TYPE:  Normal          CONTINUE these medications which have been CHANGED or REFILLED    Details   simvastatin (ZOCOR) 40 mg tablet Take one tablet by mouth at bedtime daily. Hold until follow up with primary care physician  Qty: 90 tablet, Refills: 0    PRESCRIPTION TYPE:  Normal          CONTINUE these medications which have NOT CHANGED    Details   fesoterodine ER(+) (TOVIAZ) 8 mg tablet Take 1 Tab by  mouth daily.    PRESCRIPTION TYPE:  Historical Med      fish oil /omega-3 fatty acids (SEA-OMEGA) 340/1000 mg capsule Take 1 Cap by mouth daily.    PRESCRIPTION TYPE:  Historical Med      lisinopril (PRINIVIL; ZESTRIL) 20 mg tablet Take 1 Tab by mouth as Needed. Hold for sbp<120  Qty: 90 Tab, Refills: 3    PRESCRIPTION TYPE:  No Print      multivit-min-FA-lycopen-lutein (CENTRUM SILVER MEN) 300-600-300 mcg tab Take 1 tablet by mouth every morning.    PRESCRIPTION TYPE:  Historical Med      ondansetron (ZOFRAN) 4 mg tablet Take 1 Tab by mouth every 8 hours as needed.  Refills: 0    PRESCRIPTION TYPE:  No Print      senna/docusate (SENOKOT-S) 8.6/50 mg tablet Take 1 Tab by mouth daily as needed.  Refills: 0    PRESCRIPTION TYPE:  OTC          The following medications were removed from your list. This list includes

## 2018-07-16 ENCOUNTER — Encounter: Admit: 2018-07-16 | Discharge: 2018-07-16 | Payer: MEDICARE

## 2018-07-16 LAB — CULTURE-BLOOD W/SENSITIVITY: Lab: POSITIVE — AB

## 2020-03-26 ENCOUNTER — Encounter: Admit: 2020-03-26 | Discharge: 2020-03-26 | Payer: MEDICARE

## 2020-04-08 ENCOUNTER — Encounter: Admit: 2020-04-08 | Discharge: 2020-04-08 | Payer: MEDICARE

## 2020-04-08 DIAGNOSIS — I639 Cerebral infarction, unspecified: Secondary | ICD-10-CM

## 2020-04-08 DIAGNOSIS — I10 Essential (primary) hypertension: Secondary | ICD-10-CM

## 2020-04-10 ENCOUNTER — Encounter: Admit: 2020-04-10 | Discharge: 2020-04-10 | Payer: MEDICARE

## 2020-04-10 NOTE — Progress Notes
Records Request-[facility] [phone]    Medical records request for continuation of care:    Stanley Garrett DOB [birth year]    Patient has appointment on 04/17/20   with  Dr. Geronimo Boot .    Please fax records to [facility] [phone]    Request records:           Recent Cardiac Testing    Any cardiac-related records    Procedures    Please cloud all images from cardiac testing (echo, left heart cath, chest x ray, CT chest) and Neuro testing (MRI)        Other      Thank you,      Cardiovascular Medicine  [facility]  [address]  [place], [place] [address]  Phone:  [phone]  Fax:  [phone]

## 2020-04-17 ENCOUNTER — Encounter: Admit: 2020-04-17 | Discharge: 2020-04-17

## 2020-04-17 DIAGNOSIS — I214 Non-ST elevation (NSTEMI) myocardial infarction: Secondary | ICD-10-CM

## 2020-04-17 DIAGNOSIS — I639 Cerebral infarction, unspecified: Secondary | ICD-10-CM

## 2020-04-17 DIAGNOSIS — I10 Essential (primary) hypertension: Secondary | ICD-10-CM

## 2020-04-18 ENCOUNTER — Encounter: Admit: 2020-04-18 | Discharge: 2020-04-18

## 2020-04-18 DIAGNOSIS — I25118 Atherosclerotic heart disease of native coronary artery with other forms of angina pectoris: Secondary | ICD-10-CM

## 2020-04-18 DIAGNOSIS — I2510 Atherosclerotic heart disease of native coronary artery without angina pectoris: Secondary | ICD-10-CM

## 2020-04-18 DIAGNOSIS — I10 Essential (primary) hypertension: Secondary | ICD-10-CM

## 2020-04-18 DIAGNOSIS — I639 Cerebral infarction, unspecified: Secondary | ICD-10-CM

## 2020-04-24 ENCOUNTER — Encounter: Admit: 2020-04-24 | Discharge: 2020-04-24

## 2020-04-24 ENCOUNTER — Ambulatory Visit: Admit: 2020-04-24 | Discharge: 2020-04-24

## 2020-04-24 ENCOUNTER — Ambulatory Visit: Admit: 2020-04-24 | Discharge: 2020-04-24 | Payer: MEDICARE

## 2020-04-24 DIAGNOSIS — I639 Cerebral infarction, unspecified: Secondary | ICD-10-CM

## 2020-04-24 DIAGNOSIS — R1310 Dysphagia, unspecified: Secondary | ICD-10-CM

## 2020-04-24 DIAGNOSIS — I10 Essential (primary) hypertension: Secondary | ICD-10-CM

## 2020-04-24 DIAGNOSIS — I214 Non-ST elevation (NSTEMI) myocardial infarction: Secondary | ICD-10-CM

## 2020-04-24 DIAGNOSIS — I2510 Atherosclerotic heart disease of native coronary artery without angina pectoris: Secondary | ICD-10-CM

## 2020-04-24 DIAGNOSIS — I25118 Atherosclerotic heart disease of native coronary artery with other forms of angina pectoris: Secondary | ICD-10-CM

## 2020-04-24 DIAGNOSIS — E785 Hyperlipidemia, unspecified: Secondary | ICD-10-CM

## 2020-04-24 DIAGNOSIS — I77810 Thoracic aortic ectasia: Secondary | ICD-10-CM

## 2020-04-24 DIAGNOSIS — R54 Age-related physical debility: Secondary | ICD-10-CM

## 2021-06-20 DEATH — deceased

## 2021-09-11 IMAGING — CR [ID]
1 series · 1 of 1 positions shown · non-contrast
Comparison: none

PROCEDURE: [id number]
HISTORY: CHEST PAIN
TR

[chest ap]
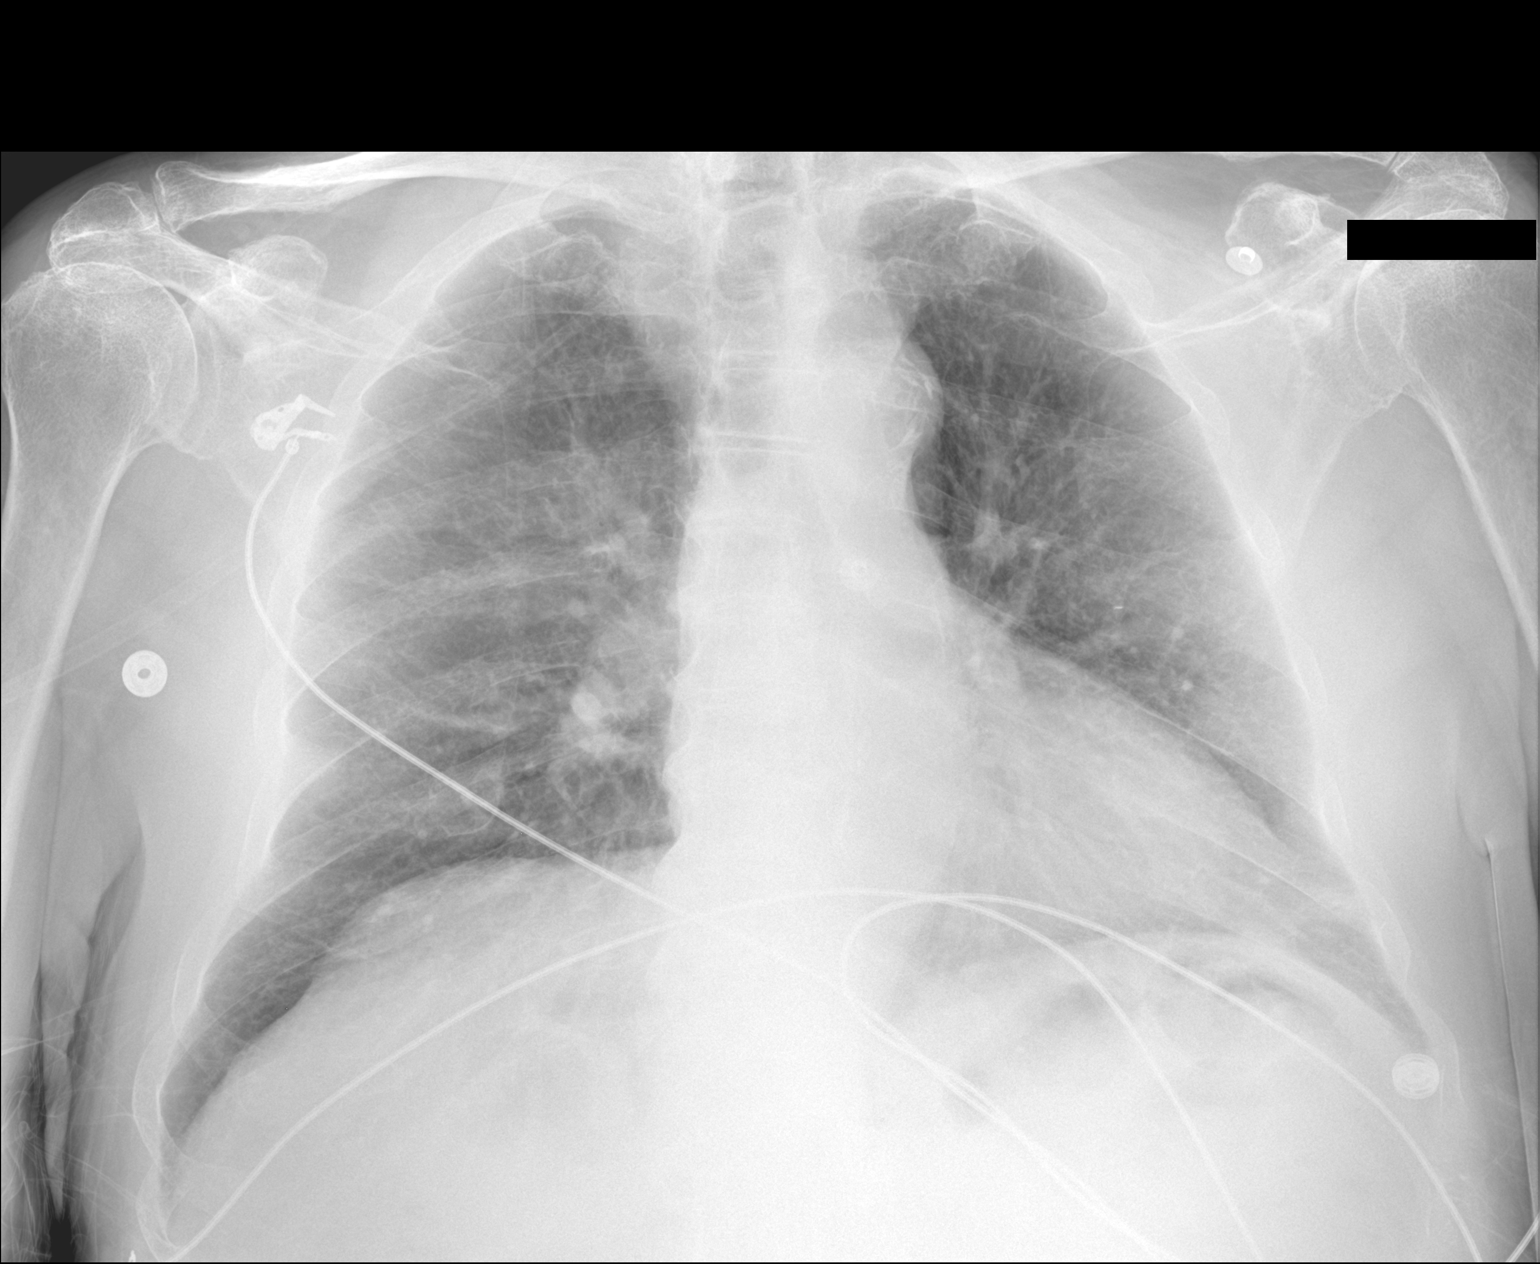

[1 of 1 positions shown; findings below may reference images not displayed]

FINDINGS: Single view of the chest without comparison shows the cardiac silhouette is normal size.
There is
no pulmonary vascular congestion seen. There are subtle groundglass airspace opacities seen in the
right
midlung. No pneumothorax is seen. No pleural effusion is seen. The visualized osseous structures
show no
acute findings.
IMPRESSION: 1. Right midlung groundglass opacities suggestive of developing infiltrate, follow up recommended.
2. No pleural effusion.

Tech Notes:

chest pain
TR
